# Patient Record
Sex: Female | Born: 1990 | Race: White | Hispanic: No | Marital: Single | Smoking: Current every day smoker
Health system: Southern US, Community
[De-identification: ages and names within clinical notes are randomized; demographics above are authoritative.]

---
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
## 2008-07-27 ENCOUNTER — Ambulatory Visit: Payer: Self-pay | Admitting: Internal Medicine

## 2012-08-29 ENCOUNTER — Emergency Department: Payer: Self-pay | Admitting: Emergency Medicine

## 2012-08-29 LAB — COMPREHENSIVE METABOLIC PANEL
Anion Gap: 5 — ABNORMAL LOW (ref 7–16)
Bilirubin,Total: 0.6 mg/dL (ref 0.2–1.0)
EGFR (African American): >60
Osmolality: 278 (ref 275–301)
Potassium: 3.4 mmol/L — ABNORMAL LOW (ref 3.5–5.1)
SGPT (ALT): 24 U/L (ref 12–78)
Sodium: 140 mmol/L (ref 136–145)
Total Protein: 8.7 g/dL — ABNORMAL HIGH (ref 6.4–8.2)

## 2012-08-29 LAB — URINALYSIS, COMPLETE
Glucose,UR: NEGATIVE mg/dL (ref 0–75)
Ketone: NEGATIVE
Nitrite: NEGATIVE
Ph: 6 (ref 4.5–8.0)
Protein: 30
RBC,UR: 3 /HPF (ref 0–5)
WBC UR: 18 /HPF (ref 0–5)

## 2012-08-29 LAB — GC/CHLAMYDIA PROBE AMP

## 2012-08-29 LAB — CBC
HCT: 45.3 % (ref 35.0–47.0)
MCV: 90 fL (ref 80–100)
Platelet: 347 10*3/uL (ref 150–440)
RBC: 5.06 10*6/uL (ref 3.80–5.20)

## 2012-08-29 LAB — WET PREP, GENITAL

## 2016-08-10 ENCOUNTER — Ambulatory Visit
Admission: EM | Admit: 2016-08-10 | Discharge: 2016-08-10 | Disposition: A | Discharge status: Home / Self Care | Payer: Self-pay | Attending: Emergency Medicine | Admitting: Emergency Medicine

## 2016-08-10 DIAGNOSIS — N9489 Other specified conditions associated with female genital organs and menstrual cycle: Secondary | ICD-10-CM

## 2016-08-10 DIAGNOSIS — J029 Acute pharyngitis, unspecified: Secondary | ICD-10-CM

## 2016-08-10 DIAGNOSIS — R300 Dysuria: Secondary | ICD-10-CM

## 2016-08-10 LAB — URINALYSIS, COMPLETE (UACMP) WITH MICROSCOPIC
Glucose, UA: NEGATIVE mg/dL
Hgb urine dipstick: NEGATIVE
Ketones, ur: NEGATIVE mg/dL
Leukocytes, UA: NEGATIVE
Nitrite: NEGATIVE
Protein, ur: NEGATIVE mg/dL
pH: 5.5 (ref 5.0–8.0)

## 2016-08-10 LAB — CHLAMYDIA/NGC RT PCR (ARMC ONLY)
Chlamydia Tr: NOT DETECTED
N GONORRHOEAE: NOT DETECTED

## 2016-08-10 LAB — WET PREP, GENITAL
Clue Cells Wet Prep HPF POC: NONE SEEN
SPERM: NONE SEEN
Trich, Wet Prep: NONE SEEN
Yeast Wet Prep HPF POC: NONE SEEN

## 2016-08-10 LAB — PREGNANCY, URINE: PREG TEST UR: NEGATIVE

## 2016-08-10 LAB — RAPID STREP SCREEN (MED CTR MEBANE ONLY): STREPTOCOCCUS, GROUP A SCREEN (DIRECT): NEGATIVE

## 2016-08-10 MED ORDER — FLUCONAZOLE 150 MG PO TABS: 150.0000 mg | ORAL_TABLET | Freq: Once | ORAL | 1 refills | Status: AC

## 2016-08-10 MED ORDER — ACYCLOVIR 400 MG PO TABS: 400.0000 mg | ORAL_TABLET | Freq: Three times a day (TID) | ORAL | 1 refills | Status: AC

## 2016-08-10 MED ORDER — IBUPROFEN 600 MG PO TABS: 600.0000 mg | ORAL_TABLET | Freq: Four times a day (QID) | ORAL | 0 refills | Status: AC | PRN

## 2016-08-10 NOTE — ED Triage Notes (Signed)
Pt states she had a sore throat a few weeks ago but took some Amoxicillin for a few days and it got better but now feels like it is returning. Pain 5/10. Also with 4 days of dysuria (burning with urination).

## 2016-08-10 NOTE — ED Provider Notes (Signed)
HPI  SUBJECTIVE:  Ann Evans is a 26 y.o. female who presents with 2 complaints. First she reports labial irritation, swelling, burning, dysuria, cloudy urine for the past 4 or 5 days. She is intermittent, minutes long bilateral pelvic pain with no aggravating or alleviating factors. She has tried essential oil for this pain. She has tried Monistat and desitin for her labia which made her symptoms worse. No alleviating factors. She denies urinary urgency, frequency, odorous urine, hematuria. No itching. No genital rash. No discharge, odor. She was on amoxicillin 2 weeks ago for an otitis media/sore throat. No new soaps, body washes. No antipyretic in the past 6-8 hours. No back or abdominal pain. She is occasionally sexually active, last contact was one week ago. She had vaginal sex with a female partner that she has been seeing on and off for one year. She is not sure if they are in a monogamous relationship. She is not sure if he is having any symptoms. STDs are a concern today. She denies having oral sex. She has never had symptoms like this before. LMP: 2 weeks ago.  Second, she reports 3 days of a sore throat. She reports nasal congestion, rhinorrhea, postnasal drip, cough, mild body aches last night. She tried gargling salt water which helped her symptoms. Symptoms worse when she drinks sodas. No fevers, abdominal pain, rash, drooling, trismus, voice changes, headache, allergy type symptoms, GERD symptoms. No contacts with strep or mono. No antipyretic in the past 6-8 hours.  She has a past medical history of UTI, Trichomonas, yeast, recurrent strep, mono, allergies. No history of pyelonephritis, nephrolithiasis. No history of gonorrhea, chlamydia, HIV, HSV, syphilis. No history of PID, ectopic, diabetes, hypertension, GERD. PMD: None.   History reviewed. No pertinent past medical history.  History reviewed. No pertinent surgical history.  History reviewed. No pertinent family  history.  Social History  Substance Use Topics  . Smoking status: Current Every Day Smoker    Packs/day: 1.00    Types: Cigarettes  . Smokeless tobacco: Never Used  . Alcohol use Yes     Comment: social    No current facility-administered medications for this encounter.   Current Outpatient Prescriptions:  .  acyclovir (ZOVIRAX) 400 MG tablet, Take 1 tablet (400 mg total) by mouth 3 (three) times daily. X 10 days, Disp: 30 tablet, Rfl: 1 .  fluconazole (DIFLUCAN) 150 MG tablet, Take 1 tablet (150 mg total) by mouth once. 1 tab po x 1. May repeat in 72 hours if no improvement, Disp: 2 tablet, Rfl: 1 .  ibuprofen (ADVIL,MOTRIN) 600 MG tablet, Take 1 tablet (600 mg total) by mouth every 6 (six) hours as needed., Disp: 30 tablet, Rfl: 0  Allergies  Allergen Reactions  . Penicillins Other (See Comments)    Unsure of reaction     ROS  As noted in HPI.   Physical Exam  BP 119/75 (BP Location: Left Arm)   Pulse 83   Temp 98.1 F (36.7 C) (Oral)   Resp 16   Ht 5\' 3"  (1.6 m)   Wt 160 lb (72.6 kg)   LMP 07/20/2016   SpO2 100%   BMI 28.34 kg/m   Constitutional: Well developed, well nourished, no acute distress Eyes:  EOMI, conjunctiva normal bilaterally HENT: Normocephalic, atraumatic,mucus membranes moist.  positive mucoid nasal congestion. Normal turbinates. No sinus tenderness. Slightly erythematous oropharynx. Normal tonsils. No exudates. Uvula midline. No cobblestoning or obvious postnasal drip. No intraoral rash. Neck: Positive shotty cervical lymphadenopathy Respiratory: Normal  inspiratory effort lungs clear bilaterally Cardiovascular: Normal rate regular rhythm no murmurs or gallops  GI: nondistended soft, no suprapubic tenderness  GU: Swollen, tender internal labia. External labia appear normal. Positive nonodorous thick white discharge with macerated, irritated, nontender skin along the inner labia.  Positive nonodorous white vaginal discharge. Os normal. No CMT. No  adnexal tenderness. Uterus smooth, nontender. Chaperone present during exam. skin: No rash, skin intact Musculoskeletal: no deformities Neurologic: Alert & oriented x 3, no focal neuro deficits Psychiatric: Speech and behavior appropriate   ED Course   Medications - No data to display  Orders Placed This Encounter  Procedures  . Rapid strep screen    Standing Status:   Standing    Number of Occurrences:   1    Order Specific Question:   Patient immune status    Answer:   Normal  . Culture, group A strep    Standing Status:   Standing    Number of Occurrences:   1  . Chlamydia/NGC rt PCR    Standing Status:   Standing    Number of Occurrences:   1    Order Specific Question:   Patient immune status    Answer:   Normal  . Wet prep, genital    Standing Status:   Standing    Number of Occurrences:   1    Order Specific Question:   Patient immune status    Answer:   Normal  . Hsv Culture And Typing    Standing Status:   Standing    Number of Occurrences:   1    Order Specific Question:   Patient immune status    Answer:   Normal  . Urinalysis, Complete w Microscopic    Standing Status:   Standing    Number of Occurrences:   1  . Pregnancy, urine    Standing Status:   Standing    Number of Occurrences:   1  . RPR    Standing Status:   Standing    Number of Occurrences:   1  . HIV antibody    Standing Status:   Standing    Number of Occurrences:   1    Results for orders placed or performed during the hospital encounter of 08/10/16 (from the past 24 hour(s))  Urinalysis, Complete w Microscopic     Status: Abnormal   Collection Time: 08/10/16  2:17 PM  Result Value Ref Range   Color, Urine YELLOW YELLOW   APPearance CLEAR CLEAR   Specific Gravity, Urine >1.030 (H) 1.005 - 1.030   pH 5.5 5.0 - 8.0   Glucose, UA NEGATIVE NEGATIVE mg/dL   Hgb urine dipstick NEGATIVE NEGATIVE   Bilirubin Urine SMALL (A) NEGATIVE   Ketones, ur NEGATIVE NEGATIVE mg/dL   Protein, ur  NEGATIVE NEGATIVE mg/dL   Nitrite NEGATIVE NEGATIVE   Leukocytes, UA NEGATIVE NEGATIVE   Squamous Epithelial / LPF 6-30 (A) NONE SEEN   WBC, UA 6-30 0 - 5 WBC/hpf   RBC / HPF 0-5 0 - 5 RBC/hpf   Bacteria, UA RARE (A) NONE SEEN   Mucous PRESENT   Rapid strep screen     Status: None   Collection Time: 08/10/16  2:17 PM  Result Value Ref Range   Streptococcus, Group A Screen (Direct) NEGATIVE NEGATIVE  Pregnancy, urine     Status: None   Collection Time: 08/10/16  2:17 PM  Result Value Ref Range   Preg Test, Ur NEGATIVE NEGATIVE  Wet prep, genital     Status: Abnormal   Collection Time: 08/10/16  2:54 PM  Result Value Ref Range   Yeast Wet Prep HPF POC NONE SEEN NONE SEEN   Trich, Wet Prep NONE SEEN NONE SEEN   Clue Cells Wet Prep HPF POC NONE SEEN NONE SEEN   WBC, Wet Prep HPF POC MODERATE (A) NONE SEEN   Sperm NONE SEEN    No results found.  ED Clinical Impression  Pharyngitis, unspecified etiology  Labial swelling  Dysuria   ED Assessment/Plan  Rapid strep negative. Patient does not have a UTI. Urine pregnancy negative.  Feel that this is a viral sore throat. In the differential is gonorrhea, but think that this is less likely as she denies having oral sex.  Wet prep negative for yeast, Trichomonas, BV.  Presentation suggestive of primary herpes infection. We will treat empirically with acyclovir. However given the recent antibiotic use, we'll also sent home with Diflucan. Sent off a herpes culture in addition to gonorrhea, chlamydia, wet prep, HIV, RPR. We'll not treat empirically for gonorrhea and chlamydia today as I think that the recent amoxicillin course is what caused her symptoms. She is to do sitz baths when she needs to urinate and also as needed for pain. Cool compresses to her labia. No evidence of urinary retention at this time. We'll provide a primary care referral.   Discussed labs, MDM, plan and followup with patient. Discussed sn/sx that should prompt  return to the ED. Patient agrees with plan.   Meds ordered this encounter  Medications  . acyclovir (ZOVIRAX) 400 MG tablet    Sig: Take 1 tablet (400 mg total) by mouth 3 (three) times daily. X 10 days    Dispense:  30 tablet    Refill:  1  . fluconazole (DIFLUCAN) 150 MG tablet    Sig: Take 1 tablet (150 mg total) by mouth once. 1 tab po x 1. May repeat in 72 hours if no improvement    Dispense:  2 tablet    Refill:  1  . ibuprofen (ADVIL,MOTRIN) 600 MG tablet    Sig: Take 1 tablet (600 mg total) by mouth every 6 (six) hours as needed.    Dispense:  30 tablet    Refill:  0    *This clinic note was created using Scientist, clinical (histocompatibility and immunogenetics)Dragon dictation software. Therefore, there may be occasional mistakes despite careful proofreading.  ?   Domenick GongMortenson, Martha Ellerby, MD 08/10/16 1535

## 2016-08-10 NOTE — Discharge Instructions (Signed)
When you need to urinate, fill the bathtub with several inches of warm water and then urinate. Rinse your self off and gently pat yourself dry. You may also put some sea salt into the water and just sit to help with the pain. This will help prevent urinary retention. Ibuprofen 600 mg with 1 g of Tylenol to help with sore throat and your vulvar pain. Apply cool compresses to the area. We will call you with any abnormal labs. Follow up with one of the primary care physicians listed below.  Here is a list of primary care providers who are taking new patients:  Dr. Elizabeth Sauereanna Jones, Dr. Schuyler AmorWilliam Plonk [address] [place] [address] [phone]  Dr. Everlene OtherJayce Cook [address]  [place] [address]  [phone]  [facility] [place] [address] [place] [address]  [place] [address]  [phone]  [facility] [address]  [place], [place] [address] [phone]  [facility] [place] [address]  [phone] [place], [place] [address]

## 2016-08-12 LAB — RPR: RPR Ser Ql: NONREACTIVE

## 2016-08-12 LAB — HIV ANTIBODY (ROUTINE TESTING W REFLEX): HIV Screen 4th Generation wRfx: NONREACTIVE

## 2016-08-13 LAB — HSV CULTURE AND TYPING

## 2016-08-13 LAB — CULTURE, GROUP A STREP (THRC)

## 2016-08-15 ENCOUNTER — Telehealth: Payer: Self-pay

## 2016-08-15 NOTE — Telephone Encounter (Signed)
Called and left message for patient to return [facility] a call back in regards to lab results.

## 2018-02-15 ENCOUNTER — Other Ambulatory Visit
Admission: AD | Admit: 2018-02-15 | Discharge: 2018-02-15 | Disposition: A | Discharge status: Home / Self Care | Attending: Family Medicine | Admitting: Family Medicine

## 2021-03-07 ENCOUNTER — Ambulatory Visit (INDEPENDENT_AMBULATORY_CARE_PROVIDER_SITE_OTHER): Payer: Self-pay

## 2021-03-07 ENCOUNTER — Other Ambulatory Visit: Payer: Self-pay

## 2021-03-07 ENCOUNTER — Ambulatory Visit
Admission: EM | Admit: 2021-03-07 | Discharge: 2021-03-07 | Disposition: A | Discharge status: Home / Self Care | Payer: Self-pay | Attending: Physician Assistant | Admitting: Physician Assistant

## 2021-03-07 DIAGNOSIS — M546 Pain in thoracic spine: Secondary | ICD-10-CM

## 2021-03-07 DIAGNOSIS — R0602 Shortness of breath: Secondary | ICD-10-CM

## 2021-03-07 MED ORDER — CYCLOBENZAPRINE HCL 10 MG PO TABS: 10.0000 mg | ORAL_TABLET | Freq: Two times a day (BID) | ORAL | 0 refills | Status: AC | PRN
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
## 2021-03-07 NOTE — ED Notes (Signed)
Call to pt with x-ray results.  Only home number listed, pt not there.  A separate number given.  L/m at 2nd number [phone]) to c/b re results.

## 2021-03-07 NOTE — Discharge Instructions (Addendum)

## 2021-07-20 ENCOUNTER — Ambulatory Visit: Payer: Self-pay | Admitting: Advanced Practice Midwife

## 2021-07-20 ENCOUNTER — Encounter: Payer: Self-pay | Admitting: Advanced Practice Midwife

## 2021-07-20 DIAGNOSIS — Z113 Encounter for screening for infections with a predominantly sexual mode of transmission: Secondary | ICD-10-CM

## 2021-07-20 DIAGNOSIS — A599 Trichomoniasis, unspecified: Secondary | ICD-10-CM

## 2021-07-20 DIAGNOSIS — F17200 Nicotine dependence, unspecified, uncomplicated: Secondary | ICD-10-CM

## 2021-07-20 NOTE — Progress Notes (Signed)
[facility] Department ? ?STI clinic/screening visit ?[address] ?[place] [address] ?[phone] ? ?Subjective:  ?Ann Evans is a 31 y.o. SWF smoker nullip female being seen today for an STI screening visit. The patient reports they do have symptoms.  Patient reports that they do not desire a pregnancy in the next year.   They reported they are not interested in discussing contraception today.   ? ?No LMP recorded. ? ? ?Patient has the following medical conditions:   ?Patient Active Problem List  ? Diagnosis Date Noted  ? Smoker 07/20/2021  ? ? ?Chief Complaint  ?Patient presents with  ? SEXUALLY TRANSMITTED DISEASE  ?  STI screening. Complains of vaginal itching  ? ? ?HPI ? ?Patient reports c/o internal and external itching, irritated vagina, white d/c x 1.5 wks. Last cig today. Last vaped 2 wks ago. Last cigar 2 years ago. Last MJ last week. Last ETOH last night (3 beers) 1x/wk. LMP 07/06/21. Last sex 07/05/21 without condom; with current  partnerx off and on x 6 years; 2 sex partners in last 3 mo. Took 1/2 Amoxicillin (instead of whole) x 2 days and last took 06/27/21 ? ?Last HIV test per patient/review of record was 08/10/2016 ?Patient reports last pap was ?2 years ago? Unknown result ? ?Screening for MPX risk: ?Does the patient have an unexplained rash? No ?Is the patient MSM? No ?Does the patient endorse multiple sex partners or anonymous sex partners? No ?Did the patient have close or sexual contact with a person diagnosed with MPX? No ?Has the patient traveled outside the [place] where MPX is endemic? No ?Is there a high clinical suspicion for MPX-- evidenced by one of the following No ? -Unlikely to be chickenpox ? -Lymphadenopathy ? -Rash that present in same phase of evolution on any given body part ?See flowsheet for further details and programmatic requirements.  ? ?Immunization history:  ? ?There is no immunization history on file for this patient.  ? ?The following portions of  the patient's history were reviewed and updated as appropriate: allergies, current medications, past medical history, past social history, past surgical history and problem list. ? ?Objective:  ?There were no vitals filed for this visit. ? ?Physical Exam ?Vitals and nursing note reviewed.  ?Constitutional:   ?   Appearance: Normal appearance. She is normal weight.  ?HENT:  ?   Head: Normocephalic and atraumatic.  ?   Mouth/Throat:  ?   Mouth: Mucous membranes are moist.  ?   Pharynx: Oropharynx is clear. No oropharyngeal exudate or posterior oropharyngeal erythema.  ?Eyes:  ?   Conjunctiva/sclera: Conjunctivae normal.  ?Pulmonary:  ?   Effort: Pulmonary effort is normal.  ?Abdominal:  ?   Palpations: Abdomen is soft. There is no mass.  ?   Tenderness: There is no abdominal tenderness. There is no rebound.  ?   Comments: Soft without masses or tenderness  ?Genitourinary: ?   General: Normal vulva.  ?   Exam position: Lithotomy position.  ?   Pubic Area: No rash or pubic lice.   ?   Labia:     ?   Right: No rash or lesion.     ?   Left: No rash or lesion.   ?   Vagina: Vaginal discharge (frothy grey sl malodorous leukorrhea, ph>4.5) and erythema (vagina with sl erythema) present. No bleeding or lesions.  ?   Cervix: Normal.  ?   Uterus: Normal.   ?  Adnexa: Right adnexa normal and left adnexa normal.  ?   Rectum: Normal.  ?Lymphadenopathy:  ?   Head:  ?   Right side of head: No preauricular or posterior auricular adenopathy.  ?   Left side of head: No preauricular or posterior auricular adenopathy.  ?   Cervical: No cervical adenopathy.  ?   Right cervical: No superficial, deep or posterior cervical adenopathy. ?   Left cervical: No superficial, deep or posterior cervical adenopathy.  ?   Upper Body:  ?   Right upper body: No supraclavicular or axillary adenopathy.  ?   Left upper body: No supraclavicular or axillary adenopathy.  ?   Lower Body: No right inguinal adenopathy. No left inguinal adenopathy.  ?Skin: ?    General: Skin is warm and dry.  ?   Findings: No rash.  ?Neurological:  ?   Mental Status: She is alert and oriented to person, place, and time.  ? ? ? ?Assessment and Plan:  ?Ann Evans is a 31 y.o. female presenting to the [facility] Department for STI screening ? ?1. Screening examination for venereal disease ?Unable to have wet mount examined in house due to lab staff out; will treat for trich based on exam, per standing orders, as pt with symptoms and results won't be back until 07/23/21 ?- WET [place], YEAST, CLUE ?- Chlamydia/Gonorrhea [facility] ? ?2. Smoker ?Counseled via 5 A's to stop ? ? ? ? ?No follow-ups on file. ? ?No future appointments. ? ?Herbie Saxon, CNM ?

## 2021-07-21 LAB — WET PREP FOR TRICH, YEAST, CLUE
Clue Cell Exam: NEGATIVE
Trichomonas Exam: POSITIVE — AB
Yeast Exam: NEGATIVE

## 2021-07-31 ENCOUNTER — Encounter: Payer: Self-pay | Admitting: Advanced Practice Midwife

## 2021-07-31 DIAGNOSIS — A5901 Trichomonal vulvovaginitis: Secondary | ICD-10-CM | POA: Insufficient documentation

## 2021-07-31 MED ORDER — METRONIDAZOLE 500 MG PO TABS: 500.0000 mg | ORAL_TABLET | Freq: Two times a day (BID) | ORAL | 0 refills | Status: AC

## 2021-07-31 NOTE — Progress Notes (Signed)
Order placed for Metronidazole 500 mg #14.  Medication was dispensed by B. Wiedenheft on 07/20/2021.

## 2021-07-31 NOTE — Addendum Note (Signed)
Addended by: Berdie Ogren on: 07/31/2021 02:11 PM   Modules accepted: Orders

## 2021-07-31 NOTE — Progress Notes (Signed)
Yes, she was treated for Trich on 07/20/2021

## 2022-02-12 DIAGNOSIS — S82451A Displaced comminuted fracture of shaft of right fibula, initial encounter for closed fracture: Secondary | ICD-10-CM | POA: Diagnosis not present

## 2022-02-12 DIAGNOSIS — S82301A Unspecified fracture of lower end of right tibia, initial encounter for closed fracture: Secondary | ICD-10-CM | POA: Diagnosis not present

## 2022-02-12 DIAGNOSIS — S82141A Displaced bicondylar fracture of right tibia, initial encounter for closed fracture: Secondary | ICD-10-CM | POA: Diagnosis not present

## 2022-03-12 DIAGNOSIS — S82201D Unspecified fracture of shaft of right tibia, subsequent encounter for closed fracture with routine healing: Secondary | ICD-10-CM | POA: Diagnosis not present

## 2022-03-12 DIAGNOSIS — S82301D Unspecified fracture of lower end of right tibia, subsequent encounter for closed fracture with routine healing: Secondary | ICD-10-CM | POA: Diagnosis not present

## 2022-03-12 DIAGNOSIS — S82451D Displaced comminuted fracture of shaft of right fibula, subsequent encounter for closed fracture with routine healing: Secondary | ICD-10-CM | POA: Diagnosis not present

## 2022-04-23 DIAGNOSIS — S82201A Unspecified fracture of shaft of right tibia, initial encounter for closed fracture: Secondary | ICD-10-CM | POA: Diagnosis not present

## 2022-04-23 DIAGNOSIS — S82451D Displaced comminuted fracture of shaft of right fibula, subsequent encounter for closed fracture with routine healing: Secondary | ICD-10-CM | POA: Diagnosis not present

## 2022-04-23 DIAGNOSIS — S82201D Unspecified fracture of shaft of right tibia, subsequent encounter for closed fracture with routine healing: Secondary | ICD-10-CM | POA: Diagnosis not present

## 2022-04-23 DIAGNOSIS — S82301D Unspecified fracture of lower end of right tibia, subsequent encounter for closed fracture with routine healing: Secondary | ICD-10-CM | POA: Diagnosis not present

## 2022-07-23 DIAGNOSIS — S82251D Displaced comminuted fracture of shaft of right tibia, subsequent encounter for closed fracture with routine healing: Secondary | ICD-10-CM | POA: Diagnosis not present

## 2022-07-23 DIAGNOSIS — S82201D Unspecified fracture of shaft of right tibia, subsequent encounter for closed fracture with routine healing: Secondary | ICD-10-CM | POA: Diagnosis not present

## 2022-07-23 DIAGNOSIS — S82451D Displaced comminuted fracture of shaft of right fibula, subsequent encounter for closed fracture with routine healing: Secondary | ICD-10-CM | POA: Diagnosis not present

## 2022-07-23 DIAGNOSIS — S82301D Unspecified fracture of lower end of right tibia, subsequent encounter for closed fracture with routine healing: Secondary | ICD-10-CM | POA: Diagnosis not present

## 2023-01-28 DIAGNOSIS — L03114 Cellulitis of left upper limb: Secondary | ICD-10-CM | POA: Diagnosis not present

## 2023-01-28 DIAGNOSIS — W540XXA Bitten by dog, initial encounter: Secondary | ICD-10-CM | POA: Diagnosis not present

## 2023-01-28 DIAGNOSIS — S51852A Open bite of left forearm, initial encounter: Secondary | ICD-10-CM | POA: Diagnosis not present

## 2023-01-28 DIAGNOSIS — Z23 Encounter for immunization: Secondary | ICD-10-CM | POA: Diagnosis not present

## 2023-01-28 DIAGNOSIS — R059 Cough, unspecified: Secondary | ICD-10-CM | POA: Diagnosis not present

## 2023-01-28 DIAGNOSIS — N898 Other specified noninflammatory disorders of vagina: Secondary | ICD-10-CM | POA: Diagnosis not present
